# Patient Record
Sex: Female | Born: 1970 | Hispanic: No | Smoking: Never smoker
Health system: Southern US, Community
[De-identification: ages and names within clinical notes are randomized; demographics above are authoritative.]

---

## 2009-09-14 ENCOUNTER — Other Ambulatory Visit: Admission: RE | Admit: 2009-09-14 | Discharge: 2009-09-14 | Payer: Self-pay | Admitting: Family Medicine

## 2011-08-26 ENCOUNTER — Other Ambulatory Visit (HOSPITAL_COMMUNITY): Payer: Self-pay | Admitting: Gynecology

## 2011-08-26 DIAGNOSIS — N979 Female infertility, unspecified: Secondary | ICD-10-CM

## 2011-09-03 ENCOUNTER — Ambulatory Visit (HOSPITAL_COMMUNITY)
Admission: RE | Admit: 2011-09-03 | Discharge: 2011-09-03 | Disposition: A | Discharge status: Home / Self Care | Payer: BC Managed Care – PPO | Source: Ambulatory Visit | Attending: Gynecology | Admitting: Gynecology

## 2011-09-03 DIAGNOSIS — N979 Female infertility, unspecified: Secondary | ICD-10-CM

## 2011-09-03 MED ORDER — IOHEXOL 300 MG/ML  SOLN: 7.0000 mL | Freq: Once | INTRAMUSCULAR | Status: AC | PRN

## 2012-07-18 IMAGING — RF DG HYSTEROGRAM
5 series · 10 of 10 positions shown · non-contrast
Comparison: none

CLINICAL DATA: Infertility.

HYSTEROSALPINGOGRAM
TECHNIQUE: Hysterosalpingogram was performed by the ordering
physician under fluoroscopy.  Fluoroscopic images are submitted for
interpretation following the procedure.
Fluoroscopy Time:  2.1 minutes.

[Series 1: run · 2 of 2 slices shown (1 of 5)]
[im 1/2]
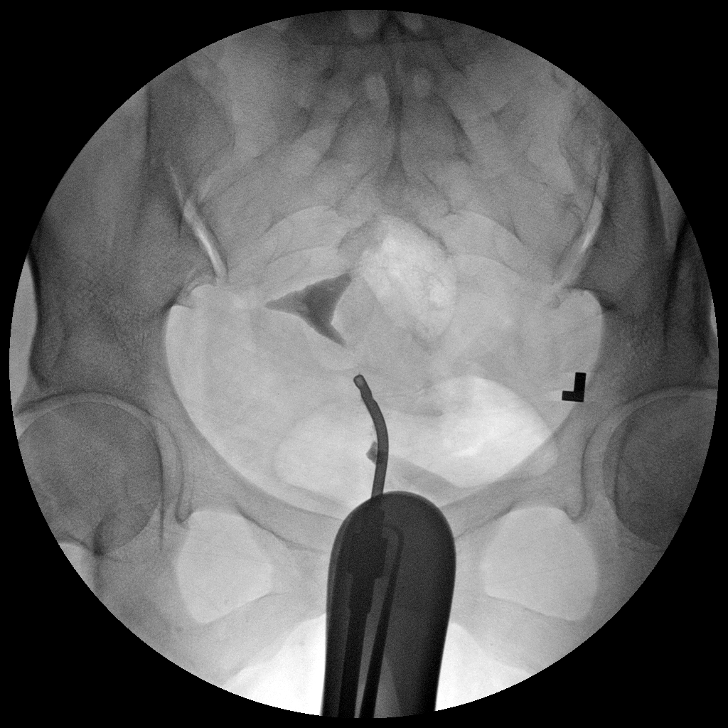
[im 2/2]
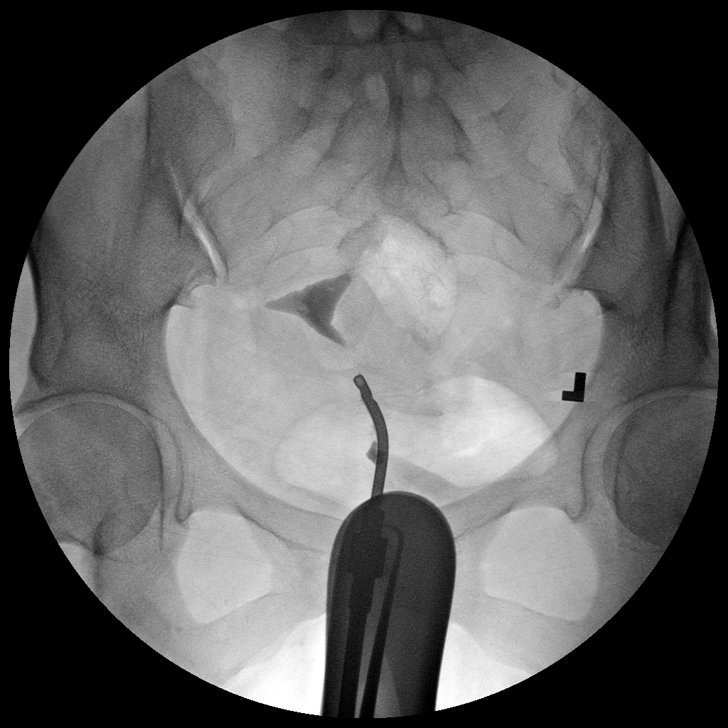

[Series 2: run · 2 of 2 slices shown (2 of 5)]
[im 1/2]
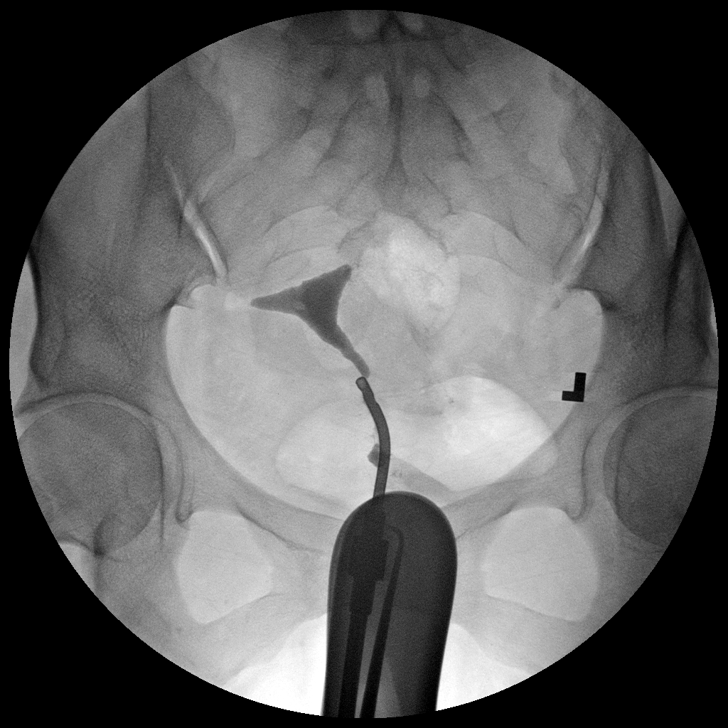
[im 2/2]
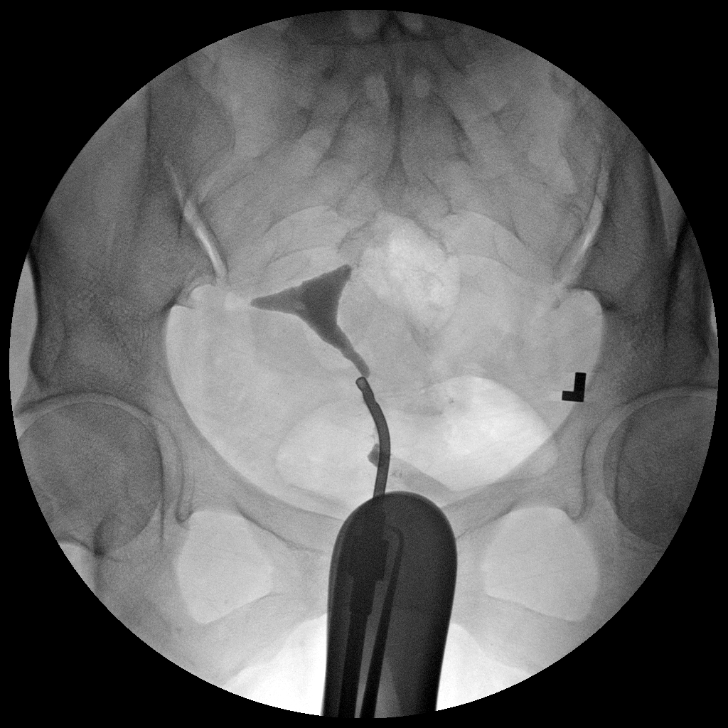

[Series 3: run · 2 of 2 slices shown (3 of 5)]
[im 1/2]
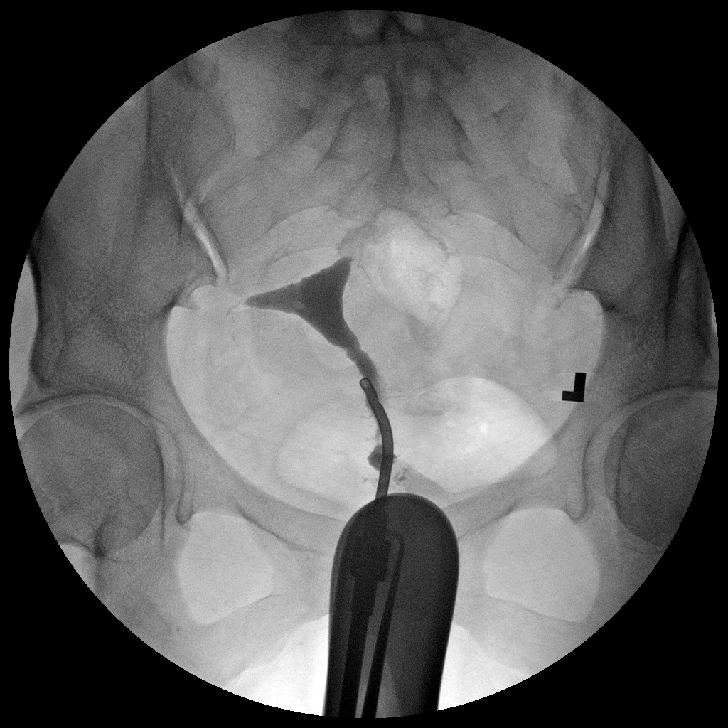
[im 2/2]
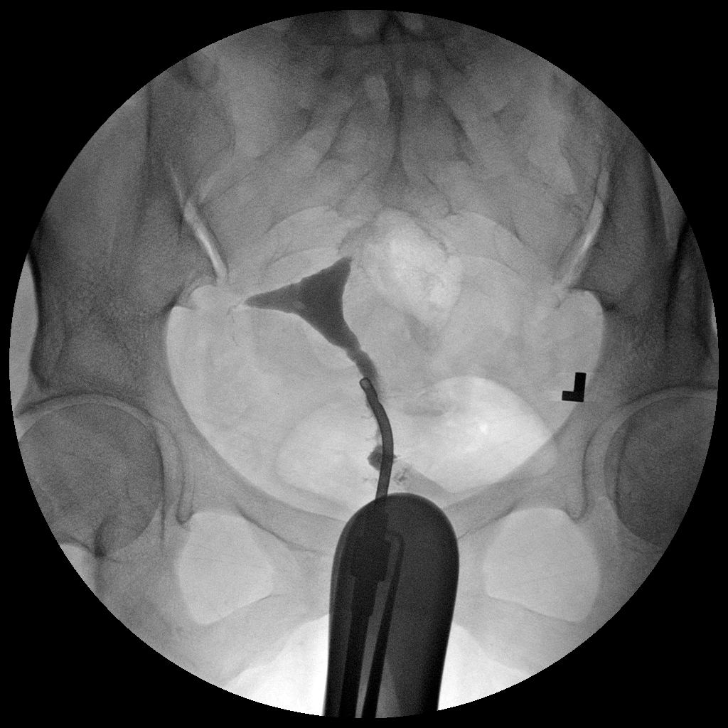

[Series 4: run · 2 of 2 slices shown (4 of 5)]
[im 1/2]
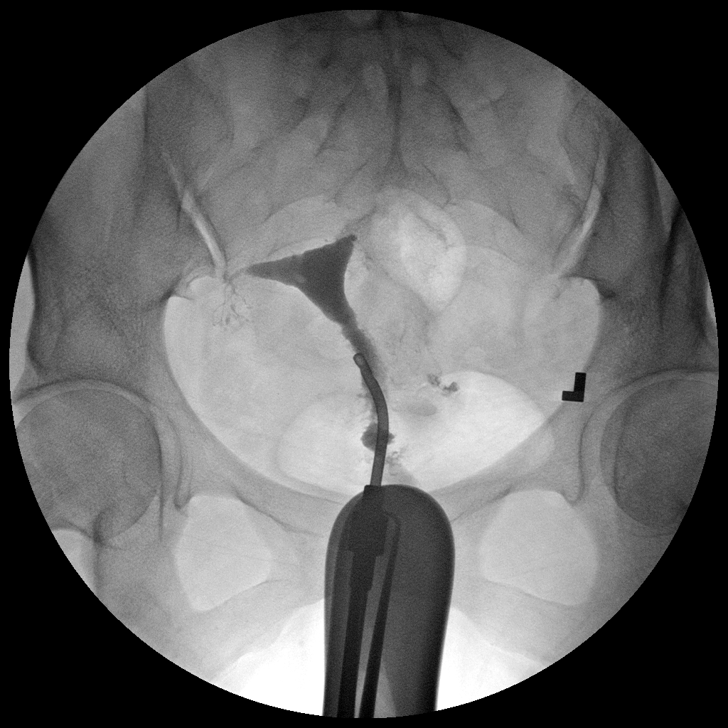
[im 2/2]
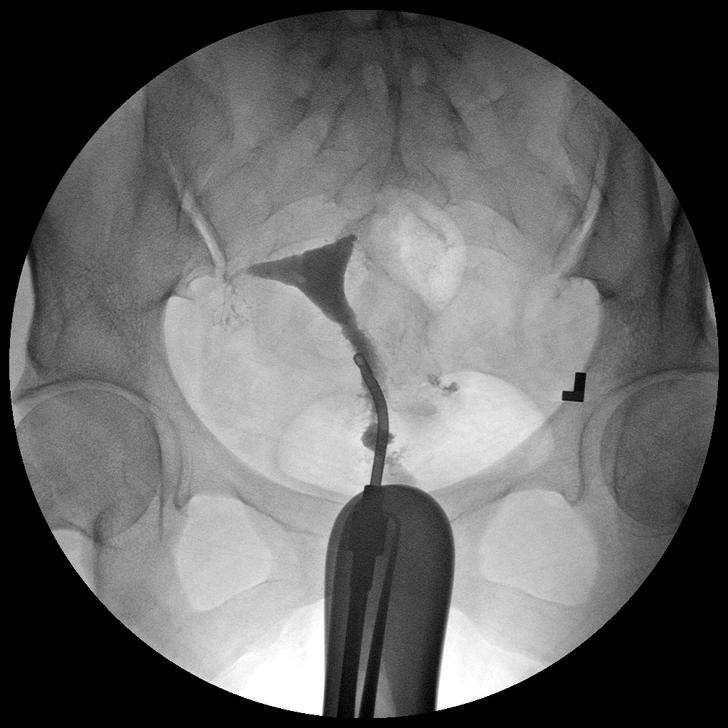

[Series 5: run · 2 of 2 slices shown (5 of 5)]
[im 1/2]
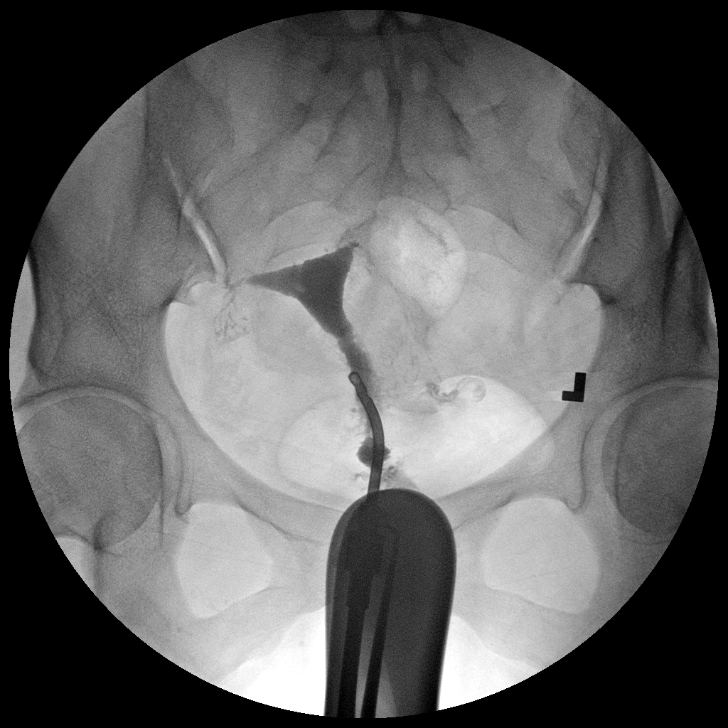
[im 2/2]
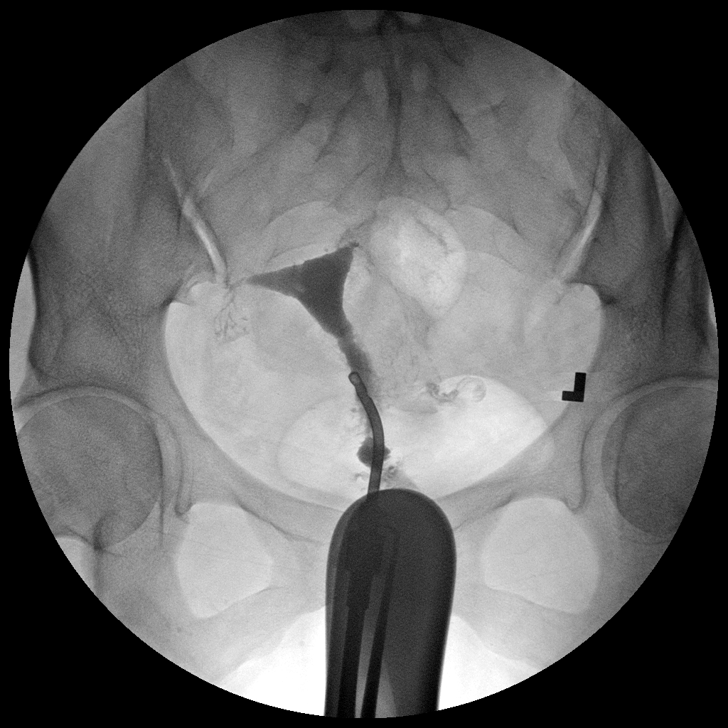

[10 of 10 positions shown; findings below may reference images not displayed]

FINDINGS: A normal endometrial morphology is seen.  The right
fallopian tube fills to the mid isthmic region.  No filling beyond
the mid isthmic region is identified.  The proximal isthmic region
of the tube demonstrates several small saccular outpouchings
suggesting salpingitis isthmic nodosa.  Venous intravasation is
seen around the proximal aspect of the right tube and would
correlate with an adequate pressure of injection.

Filling of the left fallopian tube is seen to the ampullary region.
Irregularity of the proximal isthmic portion of the tube is
identified and is suspicious for salpingitis isthmic nodosa on this
side as well.  Venous intravasation along the length of the left
tube is seen and again suggests appropriate pressure of injection.
No definite intraperitoneal spill is seen on this side.
IMPRESSION: Normal endometrial morphology.

Findings suspicious for bilateral salpingitis isthmica nodosa of
the proximal isthmic portions of the tubes.  Occlusion in the mid
isthmic region on the right.  Tubal filling to the ampullary region
on the left with no intraperitoneal spill identified.

7 ml of Omnipaque 300% was utilized for this exam.

## 2022-12-09 ENCOUNTER — Ambulatory Visit
Admission: EM | Admit: 2022-12-09 | Discharge: 2022-12-09 | Disposition: A | Discharge status: Home / Self Care | Payer: BC Managed Care – PPO | Attending: Emergency Medicine | Admitting: Emergency Medicine

## 2022-12-09 ENCOUNTER — Ambulatory Visit (INDEPENDENT_AMBULATORY_CARE_PROVIDER_SITE_OTHER): Payer: BC Managed Care – PPO

## 2022-12-09 ENCOUNTER — Encounter: Payer: Self-pay | Admitting: Emergency Medicine

## 2022-12-09 DIAGNOSIS — I10 Essential (primary) hypertension: Secondary | ICD-10-CM

## 2022-12-09 DIAGNOSIS — M25552 Pain in left hip: Secondary | ICD-10-CM

## 2022-12-09 DIAGNOSIS — R1030 Lower abdominal pain, unspecified: Secondary | ICD-10-CM | POA: Diagnosis not present

## 2022-12-09 DIAGNOSIS — R202 Paresthesia of skin: Secondary | ICD-10-CM

## 2022-12-09 DIAGNOSIS — R29898 Other symptoms and signs involving the musculoskeletal system: Secondary | ICD-10-CM

## 2022-12-09 MED ORDER — PREDNISONE 10 MG (21) PO TBPK: ORAL_TABLET | ORAL | 0 refills | Status: AC

## 2022-12-09 MED ORDER — MELOXICAM 15 MG PO TABS: 15.0000 mg | ORAL_TABLET | Freq: Every day | ORAL | 0 refills | Status: AC

## 2022-12-09 MED ORDER — TIZANIDINE HCL 4 MG PO TABS: 4.0000 mg | ORAL_TABLET | Freq: Three times a day (TID) | ORAL | 0 refills | Status: AC | PRN

## 2022-12-09 MED ORDER — AMLODIPINE BESYLATE 5 MG PO TABS: 5.0000 mg | ORAL_TABLET | Freq: Every day | ORAL | 0 refills | Status: AC

## 2022-12-09 NOTE — ED Provider Notes (Signed)
HPI  SUBJECTIVE:  Suzanne Sims is a 52 y.o. female who presents with 2 weeks of left hip pain/discomfort described as "pulling" with radiation into her groin followed by tightness, muscle spasms, weakness and numbness.  She reports sharp shooting pain going down her anterior thigh to the medial knee.  The symptoms are present only with weightbearing, arching her back and with hip extension.  She has no weakness or pain with active hip range of motion while sitting.  She recently restarted a walking regimen to lose weight, but since discontinued this when her hip started bothering her.  She states that she fell 1 week ago secondary to the left hip tightness/weakness, but has been ambulatory since.  No rash.  No other hip or back pain.  She has never had symptoms like this before.  She has tried kinesiology tape, dry needling, Aleve 500 mg twice daily and leg stretches without improvement in her symptoms.  Symptoms are better with resting, worse with back extension, walking and hip extension.  Patient has a past medical history of hypertension.  Last blood pressure recorded in December 2021 at her PCPs office 154/106 she states that she has never been on any medications for her blood pressure, but that it has been controlled with weight loss in the past.  She monitors her at home and states it is around 135/90.  No history of osteoporosis.  PCP: [facility] in [place].  History reviewed. No pertinent past medical history.  History reviewed. No pertinent surgical history.  History reviewed. No pertinent family history.  Social History   Tobacco Use   Smoking status: Never   Smokeless tobacco: Never  Vaping Use   Vaping Use: Never used  Substance Use Topics   Alcohol use: Never   Drug use: Never    No current facility-administered medications for this encounter.  Current Outpatient Medications:    amLODipine (NORVASC) 5 MG tablet, Take 1 tablet (5 mg total) by mouth daily., Disp: 30 tablet,  Rfl: 0   meloxicam (MOBIC) 15 MG tablet, Take 1 tablet (15 mg total) by mouth daily for 7 days., Disp: 7 tablet, Rfl: 0   predniSONE (STERAPRED UNI-PAK 21 TAB) 10 MG (21) TBPK tablet, Dispense one 6 day pack. Take as directed with food., Disp: 21 tablet, Rfl: 0   tiZANidine (ZANAFLEX) 4 MG tablet, Take 1 tablet (4 mg total) by mouth every 8 (eight) hours as needed for muscle spasms., Disp: 30 tablet, Rfl: 0  Allergies  Allergen Reactions   Erythromycin Hives     ROS  As noted in HPI.   Physical Exam  BP (!) 202/103 (BP Location: Left Arm) Comment: Provider aware  Pulse 89   Temp 98.6 F (37 C) (Oral)   Ht 5\' 4"  (1.626 m)   Wt 111.1 kg   SpO2 96%   BMI 42.05 kg/m    BP Readings from Last 3 Encounters:  12/09/22 (!) 202/103    Constitutional: Well developed, well nourished, no acute distress Eyes:  EOMI, conjunctiva normal bilaterally HENT: Normocephalic, atraumatic,mucus membranes moist Respiratory: Normal inspiratory effort Cardiovascular: Normal rate GI: nondistended skin: No rash, skin intact Musculoskeletal: No tenderness over the L-spine.  No signs of trauma L hip. No bruising, erythema, rash.  No muscular tenderness over gluteal muscles, down IT band, quadriceps.  Paresthesia elicited with abduction, hip extension and back extension.  No pain with passive adduction of leg. No pain/pain with flexion/int/ext rotation hip. No tenderness at sciatic notch.  Active motor strength  flexion/ext hip 5/5. Sensation to LT intact. Flexion/extension knee, hip WNL. Knee joint NT, stable.  Patient is ambulatory.  No lower extremity edema.  PT 2+.  Neurologic: Alert & oriented x 3, no focal neuro deficits.  Speech fluent. Psychiatric: Speech and behavior appropriate   ED Course   Medications - No data to display  Orders Placed This Encounter  Procedures   DG Lumbar Spine Complete    Standing Status:   Standing    Number of Occurrences:   1    Order Specific Question:   Reason  for Exam (SYMPTOM  OR DIAGNOSIS REQUIRED)    Answer:   L hip/groin pain, paresthesia anterior leg with back exgtension r/o acute changes    Order Specific Question:   Is patient pregnant?    Answer:   No    No results found for this or any previous visit (from the past 24 hour(s)). DG Lumbar Spine Complete  Result Date: 12/09/2022 CLINICAL DATA:  Left hip and groin pain with paresthesias in the leg. EXAM: LUMBAR SPINE - COMPLETE 4+ VIEW COMPARISON:  None Available. FINDINGS: Alignment is anatomic. Vertebral body and disc space heights are in normal. Vertebral body heights are normal. Endplate degenerative changes and loss of disc space height from T10-11 to T12-L1. Otherwise, minimal anterior osteophytosis at L3-4 and L4-5. Preserved disc space heights in the lumbar spine. No pars defects. Facet hypertrophy in the lower lumbar spine. IMPRESSION: 1. 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 2. Minimal anterior marginal osteophytosis in the lower lumbar spine. 3. Facet hypertrophy in the lower lumbar spine. Electronically Signed   By: Leanna Battles M.D.   On: 12/09/2022 14:00    ED Clinical Impression  1. Left hip pain   2. Weakness of left hip   3. Elevated blood pressure reading with diagnosis of hypertension      ED Assessment/Plan      Outside records reviewed.  Additional medical history obtained.  1.  Left hip/groin tightness, paresthesias with walking and back extension.  Will check L-spine.  Discussed getting pelvis/hip with patient, however, she has no pain with passive range of motion of the hip, so think that it would be low yield.  No fracture or dislocation of the hip.  Patient agrees with this.  Imaging independently reviewed.  Facet hypertrophy in the lower lumbar spine, degenerative disc disease lower thoracic thoracolumbar junction.  Minimal anterior marginal osteophyte phytosis in the lower lumbar spine.  See radiology report for details.  No  acute changes on x-ray.  She has some degenerative disc disease in the thoracic and thoracic lumbar junction, and facet hypertrophy lower lumbar spine. I suspect a compressed nerve in the L-spine with the paresthesia.  There is no evidence of stroke.  She has good strength in her hip.  Doubt infection, gout.  No evidence of shingles.  If x-rays unremarkable, will send home with prednisone for 6 days, Mobic 15 mg for 7 days combined with Tylenol 3 times daily, Zanaflex, work note for Tuesday and Wednesday.  She is to follow-up with orthopedics ASAP  2.  Elevated blood pressure with diagnosis of hypertension.  Patient states that previously she was able to her hypertension control with weight.  She states that it is in the 130s over 90 at home.  Discussed with her that today's reading is concerning, however,denies any CNS type sx such as HA, visual changes, focal paresis, or new onset seizure activity. Pt denies any CV sx such  as CP, dyspnea, palpitations, pedal edema, tearing pain radiating to back or abd. Pt denied any renal sx such as anuria or hematuria.  Patient is to follow-up with her PCP.  Strict hypertensive emergency return precautions given.  Repeat blood pressure is worse, will start patient on 5 mg of amlodipine daily.  She will follow-up with her PCP in the next day or 2.  Discussed imaging, MDM, treatment plan, and plan for follow-up with patient. Discussed sn/sx that should prompt return to the ED. patient agrees with plan.   Meds ordered this encounter  Medications   meloxicam (MOBIC) 15 MG tablet    Sig: Take 1 tablet (15 mg total) by mouth daily for 7 days.    Dispense:  7 tablet    Refill:  0   tiZANidine (ZANAFLEX) 4 MG tablet    Sig: Take 1 tablet (4 mg total) by mouth every 8 (eight) hours as needed for muscle spasms.    Dispense:  30 tablet    Refill:  0   predniSONE (STERAPRED UNI-PAK 21 TAB) 10 MG (21) TBPK tablet    Sig: Dispense one 6 day pack. Take as directed with food.     Dispense:  21 tablet    Refill:  0   amLODipine (NORVASC) 5 MG tablet    Sig: Take 1 tablet (5 mg total) by mouth daily.    Dispense:  30 tablet    Refill:  0      *This clinic note was created using Scientist, clinical (histocompatibility and immunogenetics). Therefore, there may be occasional mistakes despite careful proofreading.  ?    Domenick Gong, MD 12/09/22 1456

## 2022-12-09 NOTE — Discharge Instructions (Addendum)
Your x-ray shows that you have good disc height in your lumbar spine although you have facet hypertrophy.  You have lower thoracic and thoracolumbar junction degenerative disc disease.  Please finish the prednisone, even if you feel better.  StopAleve, take Mobic once a day.  May take 1000 mg of Tylenol with the Mobic and 1000 mg of Tylenol and additional 2 times a day.  Follow-up with orthopedics if you are not better after finishing the prednisone.  Your blood pressure is worrisome.  Make sure you keep a close eye on it at home.  I am starting you on amlodipine 5 mg today.  However, please follow-up with your PCP as soon as you possibly can for adjustment of this if necessary. Decrease your salt intake. diet and exercise will lower your blood pressure significantly. It is important to keep your blood pressure under good control, as having a elevated blood pressure for prolonged periods of time significantly increases your risk of stroke, heart attacks, kidney damage, eye damage, and other problems. Get a validated blood pressure cuff that goes on your arm, not your wrist.  Measure your blood pressure once a day, preferably at the same time every day. Keep a log of this and bring it to your next doctor's appointment.  Bring your blood pressure cuff as well.  Return here in 2 weeks for blood pressure recheck if you're unable to find a primary care physician by then. Return immediately to the ER if you start having chest pain, headache, problems seeing, problems talking, problems walking, if you feel like you're about to pass out, if you do pass out, if you have a seizure, or for any other concerns.  Go to www.goodrx.com  or www.costplusdrugs.com to look up your medications. This will give you a list of where you can find your prescriptions at the most affordable prices. Or ask the pharmacist what the cash price is, or if they have any other discount programs available to help make your medication more  affordable. This can be less expensive than what you would pay with insurance.

## 2022-12-09 NOTE — ED Triage Notes (Signed)
Patient c/o bilateral leg weakness over the last couple of weeks.  When walking can feel an electric shock or tightness.  More weight bearing causing worsening pain.  Patient did rest this weekend which seemed to helped.  Patient fell from this.  Patient has taken Aleve.
